# Patient Record
Sex: Male | Born: 1970 | Race: Black or African American | Hispanic: No | Marital: Married | Smoking: Current every day smoker
Health system: Southern US, Community
[De-identification: ages and names within clinical notes are randomized; demographics above are authoritative.]

---

## 2003-06-10 ENCOUNTER — Other Ambulatory Visit: Payer: Self-pay

## 2013-08-20 ENCOUNTER — Emergency Department: Payer: Self-pay | Admitting: Emergency Medicine

## 2013-08-20 LAB — BASIC METABOLIC PANEL
Anion Gap: 4 — ABNORMAL LOW (ref 7–16)
BUN: 8 mg/dL (ref 7–18)
CO2: 29 mmol/L (ref 21–32)
Calcium, Total: 8.4 mg/dL — ABNORMAL LOW (ref 8.5–10.1)
Chloride: 105 mmol/L (ref 98–107)
Creatinine: 1.1 mg/dL (ref 0.60–1.30)
EGFR (African American): >60
Glucose: 87 mg/dL (ref 65–99)
Osmolality: 273 (ref 275–301)
POTASSIUM: 3.7 mmol/L (ref 3.5–5.1)
SODIUM: 138 mmol/L (ref 136–145)

## 2013-08-20 LAB — CBC
HCT: 43 % (ref 40.0–52.0)
HGB: 13.7 g/dL (ref 13.0–18.0)
MCH: 28.4 pg (ref 26.0–34.0)
MCHC: 31.8 g/dL — ABNORMAL LOW (ref 32.0–36.0)
MCV: 90 fL (ref 80–100)
Platelet: 256 10*3/uL (ref 150–440)
RBC: 4.8 10*6/uL (ref 4.40–5.90)
RDW: 12.9 % (ref 11.5–14.5)
WBC: 6.1 10*3/uL (ref 3.8–10.6)

## 2013-08-20 LAB — TROPONIN I

## 2018-04-28 ENCOUNTER — Ambulatory Visit (INDEPENDENT_AMBULATORY_CARE_PROVIDER_SITE_OTHER): Payer: Worker's Compensation

## 2018-04-28 ENCOUNTER — Ambulatory Visit
Admission: EM | Admit: 2018-04-28 | Discharge: 2018-04-28 | Disposition: A | Discharge status: Home / Self Care | Payer: Worker's Compensation | Attending: Emergency Medicine | Admitting: Emergency Medicine

## 2018-04-28 ENCOUNTER — Other Ambulatory Visit: Payer: Self-pay

## 2018-04-28 DIAGNOSIS — R52 Pain, unspecified: Secondary | ICD-10-CM | POA: Diagnosis not present

## 2018-04-28 DIAGNOSIS — Z23 Encounter for immunization: Secondary | ICD-10-CM | POA: Diagnosis not present

## 2018-04-28 DIAGNOSIS — S62640A Nondisplaced fracture of proximal phalanx of right index finger, initial encounter for closed fracture: Secondary | ICD-10-CM

## 2018-04-28 DIAGNOSIS — X58XXXA Exposure to other specified factors, initial encounter: Secondary | ICD-10-CM

## 2018-04-28 MED ORDER — IBUPROFEN 600 MG PO TABS: 600.0000 mg | ORAL_TABLET | Freq: Four times a day (QID) | ORAL | 0 refills | Status: AC | PRN

## 2018-04-28 MED ORDER — TETANUS-DIPHTH-ACELL PERTUSSIS 5-2.5-18.5 LF-MCG/0.5 IM SUSP
0.5000 mL | Freq: Once | INTRAMUSCULAR | Status: AC
  Administered 2018-04-28: 0.5 mL via INTRAMUSCULAR

## 2018-04-28 NOTE — ED Triage Notes (Signed)
Pt reports Worker's comp. Yesterday he got his right hand caught between a piece of machinery and the wall. Abrasion between 1st and second finger right hand. First finger swollen

## 2018-04-28 NOTE — ED Provider Notes (Signed)
HPI  SUBJECTIVE:  Wayne Bryant is a right-handed 47 y.o. male who presents with right index finger pain, swelling, limitation of motion after wedging his hand in between a table and a machine door yesterday while at work.  He reports numbness distally.  He describes the pain as soreness, intermittent, present only with movement.  No alleviating factors.  He has not tried anything for this.  Symptoms are worse with moving his finger.  He is not on any anticoagulants or antiplatelets.  No history of diabetes, hypertension.  He is a smoker.  This is a workers comp case.  History reviewed. No pertinent past medical history.  History reviewed. No pertinent surgical history.  History reviewed. No pertinent family history.  Social History   Tobacco Use  . Smoking status: Current Every Day Smoker    Packs/day: 0.50    Types: Cigarettes  . Smokeless tobacco: Never Used  Substance Use Topics  . Alcohol use: Yes    Comment: social  . Drug use: Never    No current facility-administered medications for this encounter.   Current Outpatient Medications:  .  ibuprofen (ADVIL,MOTRIN) 600 MG tablet, Take 1 tablet (600 mg total) by mouth every 6 (six) hours as needed., Disp: 30 tablet, Rfl: 0  No Known Allergies   ROS  As noted in HPI.   Physical Exam  BP 121/67 (BP Location: Left Arm)   Pulse 72   Temp 98.2 F (36.8 C) (Oral)   Resp 16   Ht 5\' 8"  (1.727 m)   Wt 63.5 kg   SpO2 100%   BMI 21.29 kg/m   Constitutional: Well developed, well nourished, no acute distress Eyes:  EOMI, conjunctiva normal bilaterally HENT: Normocephalic, atraumatic,mucus membranes moist Respiratory: Normal inspiratory effort Cardiovascular: Normal rate GI: nondistended skin: No rash, skin intact Musculoskeletal: Positive swelling, tenderness at the proximal phalanx of the right index finger.  Positive bruising palm of hand.  Mild internal rotation of the right index finger.  FDP/FDS intact.   Two-point discrimination at the distal phalanx absent, but intact along the middle phalanx.  Cap refill at the distal phalanx less than 2 seconds. positive superficial laceration to volar finger at the proximal phalanx.  No other tenderness or evidence of injury to the hand.   Neurologic: Alert & oriented x 3, no focal neuro deficits and Psychiatric: Speech and behavior appropriate   ED Course   Medications  Tdap (BOOSTRIX) injection 0.5 mL (0.5 mLs Intramuscular Given 04/28/18 1244)    Orders Placed This Encounter  Procedures  . DG Hand Complete Right    Standing Status:   Standing    Number of Occurrences:   1    Order Specific Question:   Symptom/Reason for Exam    Answer:   Pain [[id number]]    Order Specific Question:   Radiology Contrast Protocol - do NOT remove file path    Answer:   [email or web address]  . Apply other splint    Standing Status:   Standing    Number of Occurrences:   1    Order Specific Question:   Laterality    Answer:   Right    Order Specific Question:   Splint type    Answer:   radial gutter    No results found for this or any previous visit (from the past 24 hour(s)). Dg Hand Complete Right  Result Date: 04/28/2018 CLINICAL DATA:  Right index finger crush injury. EXAM: RIGHT HAND - COMPLETE 3+ VIEW COMPARISON:  None. FINDINGS: Acute, nondisplaced fracture of the index finger proximal phalanx with extension into the MCP joint. No additional fracture. No dislocation. Joint spaces are preserved. Bone mineralization is normal. Soft tissue swelling of the index finger. IMPRESSION: 1. Acute, nondisplaced, intra-articular fracture of the index finger proximal phalanx. Electronically Signed   By: Obie DredgeWilliam T Derry M.D.   On: 04/28/2018 13:22    ED Clinical Impression  Closed nondisplaced fracture of proximal phalanx of right index finger, initial encounter  Pain - Plan: DG Hand Complete Right, DG Hand Complete Right   ED  Assessment/Plan  updating tetanus [place] Narcotic database reviewed for this patient, and feel that the risk/benefit ratio today is favorable for proceeding with a prescription for controlled substance.  No opiate prescriptions in the past 2 years.  Reviewed imaging independently.  Nondisplaced intra-articular fracture of the proximal phalanx of the index finger see radiology report for full details  patient is vascularly intact.  While he has absent sensation in the distal phalanx he does have it in the middle phalanx.  Suspect that this is temporary.  No evidence of compartment syndrome.  Do not think the patient needs antibiotics for the laceration, is to keep clean with soap and water and apply bacitracin.  Placing in ulnar gutter splint.   Will refer him to Fannie Kneeatherine McManama at [facility] and [facility] for further evaluation and referral to hand surgery because of the internal rotation of his finger and the intra-articular extension of the fracture.  Patient declined prescription of pain medicines.  600 mg ibuprofen and gram of Tylenol 3 or 4 times a day as needed. filled out [employer].  Discussed imaging, MDM, treatment plan, and plan for follow-up with patient.  patient agrees with plan.   Meds ordered this encounter  Medications  . Tdap (BOOSTRIX) injection 0.5 mL  . ibuprofen (ADVIL,MOTRIN) 600 MG tablet    Sig: Take 1 tablet (600 mg total) by mouth every 6 (six) hours as needed.    Dispense:  30 tablet    Refill:  0    *This clinic note was created using Scientist, clinical (histocompatibility and immunogenetics)Dragon dictation software. Therefore, there may be occasional mistakes despite careful proofreading.   ?   Domenick GongMortenson, Gladyse Corvin, MD 04/28/18 1402

## 2018-04-28 NOTE — Discharge Instructions (Addendum)
1 g of Tylenol 3 or 4 times a day as needed for pain.  Ice your finger for 20 minutes at a time.  Wear the splint at all times until you are seen by occupational health.  You may need a referral to hand surgery.

## 2019-04-29 ENCOUNTER — Other Ambulatory Visit: Payer: Self-pay

## 2019-04-29 ENCOUNTER — Ambulatory Visit: Payer: Self-pay | Admitting: Physician Assistant

## 2019-04-29 ENCOUNTER — Encounter: Payer: Self-pay | Admitting: Physician Assistant

## 2019-04-29 DIAGNOSIS — Z113 Encounter for screening for infections with a predominantly sexual mode of transmission: Secondary | ICD-10-CM

## 2019-04-29 DIAGNOSIS — Z202 Contact with and (suspected) exposure to infections with a predominantly sexual mode of transmission: Secondary | ICD-10-CM

## 2019-04-29 MED ORDER — METRONIDAZOLE 500 MG PO TABS: 2000.0000 mg | ORAL_TABLET | Freq: Once | ORAL | 0 refills | Status: AC

## 2019-04-29 NOTE — Progress Notes (Signed)
    STI clinic/screening visit  Subjective:  Wayne Bryant is a 48 y.o. male being seen today for an STI screening visit. The patient reports they do not have symptoms.  Patient has the following medical conditions:  There are no active problems to display for this patient.    Chief Complaint  Patient presents with  . SEXUALLY TRANSMITTED DISEASE    HPI  Patient reports that he is a contact to [place].  Denies symptoms and concerns today.  See flowsheet for further details and programmatic requirements.    The following portions of the patient's history were reviewed and updated as appropriate: allergies, current medications, past medical history, past social history, past surgical history and problem list.  Objective:  There were no vitals filed for this visit.  Physical Exam Constitutional:      General: He is not in acute distress.    Appearance: Normal appearance.  HENT:     Head: Normocephalic and atraumatic.     Mouth/Throat:     Mouth: Mucous membranes are moist.     Pharynx: Oropharynx is clear. No oropharyngeal exudate or posterior oropharyngeal erythema.  Eyes:     Conjunctiva/sclera: Conjunctivae normal.  Neck:     Musculoskeletal: Neck supple.  Pulmonary:     Effort: Pulmonary effort is normal.  Abdominal:     Palpations: Abdomen is soft. There is no mass.     Tenderness: There is no abdominal tenderness. There is no guarding or rebound.  Genitourinary:    Penis: Normal.      Scrotum/Testes: Normal.     Comments: Pubic area without nits, lice, edema, erythema, lesions and inguinal adenopathy. Penis circumcised and without discharge at meatus. Lymphadenopathy:     Cervical: No cervical adenopathy.  Skin:    General: Skin is warm and dry.     Findings: No bruising, erythema, lesion or rash.  Neurological:     Mental Status: He is alert and oriented to person, place, and time.  Psychiatric:        Mood and Affect: Mood normal.        Behavior:  Behavior normal.        Thought Content: Thought content normal.        Judgment: Judgment normal.       Assessment and Plan:  Wayne Bryant is a 48 y.o. male presenting to the [facility] Department for STI screening  1. Screening for STD (sexually transmitted disease) Patient into clinic without symptoms. Rec condoms with all sex. Await test results.  Counseled that RN will call if needs to RTC for further treatment. - Chlamydia/Gonorrhea [facility] - HIV [facility] - Syphilis Serology, [place]  2. Venereal disease contact Will treat as a contact to [place] with Metronidazole 2 g po at one time with food, no EtOH for 24 hr before and until 72 hr after completing medicine. No sex for 7 days and until after partner completes treatment. RTC for re-treatment if vomits < 2 hr after taking medicine.  - metroNIDAZOLE (FLAGYL) 500 MG tablet; Take 4 tablets (2,000 mg total) by mouth once for 1 dose.  Dispense: 4 tablet; Refill: 0     No follow-ups on file.  No future appointments.  Jerene Dilling, PA

## 2020-04-24 ENCOUNTER — Encounter: Payer: Self-pay | Admitting: Emergency Medicine

## 2020-04-24 ENCOUNTER — Emergency Department: Payer: BC Managed Care – PPO

## 2020-04-24 ENCOUNTER — Other Ambulatory Visit: Payer: Self-pay

## 2020-04-24 ENCOUNTER — Emergency Department
Admission: EM | Admit: 2020-04-24 | Discharge: 2020-04-24 | Disposition: A | Discharge status: Home / Self Care | Payer: BC Managed Care – PPO | Attending: Emergency Medicine | Admitting: Emergency Medicine

## 2020-04-24 DIAGNOSIS — S0003XA Contusion of scalp, initial encounter: Secondary | ICD-10-CM | POA: Insufficient documentation

## 2020-04-24 DIAGNOSIS — W11XXXA Fall on and from ladder, initial encounter: Secondary | ICD-10-CM | POA: Diagnosis not present

## 2020-04-24 DIAGNOSIS — S2232XA Fracture of one rib, left side, initial encounter for closed fracture: Secondary | ICD-10-CM | POA: Diagnosis not present

## 2020-04-24 DIAGNOSIS — F17210 Nicotine dependence, cigarettes, uncomplicated: Secondary | ICD-10-CM | POA: Diagnosis not present

## 2020-04-24 DIAGNOSIS — W19XXXA Unspecified fall, initial encounter: Secondary | ICD-10-CM

## 2020-04-24 DIAGNOSIS — R0781 Pleurodynia: Secondary | ICD-10-CM | POA: Diagnosis present

## 2020-04-24 MED ORDER — HYDROCODONE-ACETAMINOPHEN 5-325 MG PO TABS: 1.0000 | ORAL_TABLET | Freq: Four times a day (QID) | ORAL | 0 refills | Status: AC | PRN

## 2020-04-24 NOTE — Discharge Instructions (Addendum)
Follow-up with your primary care provider or [facility] acute care if any continued problems or concerns.  Return to the emergency department if any severe worsening of your symptoms over the weekend.  A prescription for pain medication was sent to your pharmacy.  This is 1 every 6 hours as needed for moderate pain.  You may also take ibuprofen with this medication if needed which will help with inflammation.  Use ice to your rib as needed for discomfort and also use a pillow and take deep breaths to avoid getting pneumonia.  Generally it takes approximately 4 weeks to heal.  Follow-up with your primary care provider if any continued problems.

## 2020-04-24 NOTE — ED Triage Notes (Signed)
Pt presents to ED via POV with c/o fall 2 days ago at approx 1730pm. Pt states was able to go to work yesterday. Pt states fell off bottom few steps of a ladder. Pt states +LOC, c/o L rib cage pain. Pt A&O x4, NAD noted, pt ambulatory without difficulty at this time. Pt with noted full ROM to all extremities.

## 2020-04-24 NOTE — ED Notes (Signed)
See triage note.  Says he was several rungs up on ladder and slipped and fell on left side ont to grass and hit head.  Says he may have lost consciousness because when he opened his eyes every thing was blurry.  He ambulated to the room without difficulty.

## 2020-04-24 NOTE — ED Provider Notes (Signed)
[facility] Emergency Department Provider Note   ____________________________________________   First MD Initiated Contact with Patient 04/24/20 1133     (approximate)  I have reviewed the triage vital signs and the nursing notes.   HISTORY  Chief Complaint Fall   HPI Wayne Bryant is a 49 y.o. male presents to the ED with complaint of pain to his left rib cage after a fall that occurred 2 days ago.  Patient states that he was coming down a ladder after removing some leaves from a gutter and fell from the ladder onto the ground approximately 4 to 5 feet.  He states he did hit his head by falling backwards and possible LOC.  He denies any nausea, vomiting or visual changes at this time.  He states for short period of time things went "blurry".  Patient also has pain to his left ribs when taking deep breaths.  He rates his pain as a 10 out of 10.       History reviewed. No pertinent past medical history.  There are no problems to display for this patient.   History reviewed. No pertinent surgical history.  Prior to Admission medications   Medication Sig Start Date End Date Taking? Authorizing Provider  HYDROcodone-acetaminophen (NORCO/VICODIN) 5-325 MG tablet Take 1 tablet by mouth every 6 (six) hours as needed for moderate pain. 04/24/20   Tommi Rumps, PA-C  ibuprofen (ADVIL,MOTRIN) 600 MG tablet Take 1 tablet (600 mg total) by mouth every 6 (six) hours as needed. 04/28/18   Domenick Gong, MD    Allergies Patient has no known allergies.  No family history on file.  Social History Social History   Tobacco Use  . Smoking status: Current Every Day Smoker    Packs/day: 0.50    Types: Cigarettes  . Smokeless tobacco: Never Used  Substance Use Topics  . Alcohol use: Yes    Comment: social  . Drug use: Never    Review of Systems Constitutional: No fever/chills Eyes: No visual changes. ENT: No sore throat.  No facial  trauma. Cardiovascular: Denies chest pain. Respiratory: Denies shortness of breath.  Positive left rib pain. Gastrointestinal: No abdominal pain.  No nausea, no vomiting.  No diarrhea.  Genitourinary: Negative for dysuria. Musculoskeletal: Negative for back pain.  Negative for cervical pain. Skin: Negative for rash. Neurological: Negative for headaches, focal weakness or numbness. ____________________________________________   PHYSICAL EXAM:  VITAL SIGNS: ED Triage Vitals [04/24/20 1114]  Enc Vitals Group     BP 123/65     Pulse Rate 71     Resp 18     Temp 98.4 F (36.9 C)     Temp Source Oral     SpO2 100 %     Weight 130 lb (59 kg)     Height 5\' 8"  (1.727 m)     Head Circumference      Peak Flow      Pain Score 10     Pain Loc      Pain Edu?      Excl. in GC?     Constitutional: Alert and oriented. Well appearing and in no acute distress. Eyes: Conjunctivae are normal. PERRL. EOMI. Head: Atraumatic. Nose: No congestion/rhinnorhea. Mouth/Throat: Mucous membranes are moist.  Oropharynx non-erythematous. Neck: No stridor.  No cervical pain on palpation posteriorly.  Range of motion is that restriction or pain. Cardiovascular: Normal rate, regular rhythm. Grossly normal heart sounds.  Good peripheral circulation. Respiratory: Normal respiratory effort.  No retractions. Lungs CTAB.  Left lateral ribs are extremely tender to palpation especially proximal May 9, 10th and 11th area laterally.  No soft tissue edema or discoloration is noted of the skin.  Gait is intact. Gastrointestinal: Soft and nontender. No distention.  Sounds are normoactive x4 quadrants. Musculoskeletal: Moves upper and lower extremities with any difficulty.  Normal gait was noted.  No tenderness is noted on palpation of the thoracic or lumbar spine.  Normal gait was noted. Neurologic:  Normal speech and language. No gross focal neurologic deficits are appreciated. No gait instability. Skin:  Skin is warm,  dry and intact. No rash noted. Psychiatric: Mood and affect are normal. Speech and behavior are normal.  ____________________________________________   LABS (all labs ordered are listed, but only abnormal results are displayed)  Labs Reviewed - No data to display ____________________________________________    RADIOLOGY I, Tommi Rumps, personally viewed and evaluated these images (plain radiographs) as part of my medical decision making, as well as reviewing the written report by the radiologist.   Official radiology report(s): DG Ribs Unilateral W/Chest Left  Result Date: 04/24/2020 CLINICAL DATA:  Status post fall.  Larey Seat off a ladder. EXAM: LEFT RIBS AND CHEST - 3+ VIEW COMPARISON:  08/20/2013 FINDINGS: Nondisplaced fracture of the left tenth anterolateral rib. There is no evidence of pneumothorax or pleural effusion. Both lungs are clear. Heart size and mediastinal contours are within normal limits. IMPRESSION: Nondisplaced fracture of the left tenth anterolateral rib. Electronically Signed   By: Elige Ko   On: 04/24/2020 12:56   CT Head Wo Contrast  Result Date: 04/24/2020 CLINICAL DATA:  Fall, head trauma EXAM: CT HEAD WITHOUT CONTRAST TECHNIQUE: Contiguous axial images were obtained from the base of the skull through the vertex without intravenous contrast. COMPARISON:  None. FINDINGS: Brain: No evidence of acute infarction, hemorrhage, hydrocephalus, extra-axial collection or mass lesion/mass effect. Vascular: No hyperdense vessel or unexpected calcification. Skull: Normal. Negative for fracture or focal lesion. Sinuses/Orbits: No acute finding. Other: None. IMPRESSION: No acute intracranial findings. Electronically Signed   By: Duanne Guess D.O.   On: 04/24/2020 13:01    ____________________________________________   PROCEDURES  Procedure(s) performed (including Critical Care):  Procedures   ____________________________________________   INITIAL  IMPRESSION / ASSESSMENT AND PLAN / ED COURSE  As part of my medical decision making, I reviewed the following data within the electronic MEDICAL RECORD NUMBER Notes from prior ED visits and [place] Controlled Substance Database  49 year old male presents to the ED with complaint of continued left rib pain after he fell approximately 5 feet while on a ladder cleaning the gutter.  Patient states that he fell backwards landing on his left side but also hitting the back of his head.  There is a question of LOC but he denies any continued nausea, vomiting or visual changes.  Patient has increased pain with deep inspiration.  Physical exam was benign however left rib x-ray does show 1 nondisplaced fracture of the 10th rib.  Patient was made aware.  A prescription for hydrocodone was sent to his pharmacy to take every 6 hours as needed for moderate pain.  He is also encouraged to take ibuprofen for inflammation.  We discussed using a pillow when he is taking deep breaths or feels that he is going to cough to help splint the area and prevent pneumonia by taking deep breaths.  Patient is to follow-up with his PCP or [facility] acute care if any continued problems. ____________________________________________  FINAL CLINICAL IMPRESSION(S) / ED DIAGNOSES  Final diagnoses:  Closed fracture of one rib of left side, initial encounter  Contusion of scalp, initial encounter  Fall, initial encounter     ED Discharge Orders         Ordered    HYDROcodone-acetaminophen (NORCO/VICODIN) 5-325 MG tablet  Every 6 hours PRN        04/24/20 1318          *Please note:  Adrean Heitz was evaluated in Emergency Department on 04/24/2020 for the symptoms described in the history of present illness. He was evaluated in the context of the global COVID-19 pandemic, which necessitated consideration that the patient might be at risk for infection with the SARS-CoV-2 virus that causes COVID-19. Institutional protocols and  algorithms that pertain to the evaluation of patients at risk for COVID-19 are in a state of rapid change based on information released by regulatory bodies including the CDC and federal and state organizations. These policies and algorithms were followed during the patient's care in the ED.  Some ED evaluations and interventions may be delayed as a result of limited staffing during and the pandemic.*   Note:  This document was prepared using Dragon voice recognition software and may include unintentional dictation errors.    Tommi Rumps, PA-C 04/24/20 1332    Delton Prairie, MD 04/24/20 [phone]

## 2020-09-10 ENCOUNTER — Ambulatory Visit: Payer: Self-pay | Admitting: Physician Assistant

## 2020-09-10 ENCOUNTER — Encounter: Payer: Self-pay | Admitting: Physician Assistant

## 2020-09-10 ENCOUNTER — Other Ambulatory Visit: Payer: Self-pay

## 2020-09-10 DIAGNOSIS — Z113 Encounter for screening for infections with a predominantly sexual mode of transmission: Secondary | ICD-10-CM

## 2020-09-10 DIAGNOSIS — N341 Nonspecific urethritis: Secondary | ICD-10-CM

## 2020-09-10 LAB — GRAM STAIN

## 2020-09-10 MED ORDER — DOXYCYCLINE HYCLATE 100 MG PO TABS: 100.0000 mg | ORAL_TABLET | Freq: Two times a day (BID) | ORAL | 0 refills | Status: AC

## 2020-09-13 ENCOUNTER — Encounter: Payer: Self-pay | Admitting: Physician Assistant

## 2020-09-13 NOTE — Progress Notes (Signed)
[facility] Department STI clinic/screening visit  Subjective:  Wayne Bryant is a 50 y.o. male being seen today for an STI screening visit. The patient reports they do have symptoms.    Patient has the following medical conditions:  There are no problems to display for this patient.    Chief Complaint  Patient presents with  . SEXUALLY TRANSMITTED DISEASE    screening    HPI  Patient reports that he has had "a funny feeling when peeing" for about 1 week.  Denies other symptoms, chronic conditions, and surgeries.  Reports that he last had a HIV test about 7-8 years ago and last void prior to sample collection for Gram stain was over 2 hr ago.     See flowsheet for further details and programmatic requirements.    The following portions of the patient's history were reviewed and updated as appropriate: allergies, current medications, past medical history, past social history, past surgical history and problem list.  Objective:  There were no vitals filed for this visit.  Physical Exam Constitutional:      General: He is not in acute distress.    Appearance: Normal appearance.  HENT:     Head: Normocephalic and atraumatic.     Comments: No nits,lice, or hair loss. No cervical, supraclavicular or axillary adenopathy.    Mouth/Throat:     Mouth: Mucous membranes are moist.     Pharynx: Oropharynx is clear. No oropharyngeal exudate or posterior oropharyngeal erythema.  Eyes:     Conjunctiva/sclera: Conjunctivae normal.  Pulmonary:     Effort: Pulmonary effort is normal.  Abdominal:     Palpations: Abdomen is soft. There is no mass.     Tenderness: There is no abdominal tenderness. There is no guarding or rebound.  Genitourinary:    Penis: Normal.      Testes: Normal.     Comments: Pubic area without nits, lice, hair loss, edema, erythema, lesions and inguinal adenopathy. Penis circumcised without rash or lesions. Small amount of whitish/clear  discharge at meatus Testicles descended bilaterally,nt, no masses or edema. Musculoskeletal:     Cervical back: Neck supple. No tenderness.  Skin:    General: Skin is warm and dry.     Findings: No bruising, erythema, lesion or rash.  Neurological:     Mental Status: He is alert and oriented to person, place, and time.  Psychiatric:        Mood and Affect: Mood normal.        Behavior: Behavior normal.        Thought Content: Thought content normal.        Judgment: Judgment normal.       Assessment and Plan:  Kinston Magnan is a 50 y.o. male presenting to the [facility] Department for STI screening  1. Screening for STD (sexually transmitted disease) Patient into clinic with symptoms. Reviewed with patient Gram stain results. Rec condoms with all sex. Await test results.  Counseled that RN will call if needs to RTC for treatment once results are back. - Gram stain - Gonococcus culture - HIV [facility] - Syphilis Serology, [facility]  2. NGU (nongonococcal urethritis) Will treat for NGU due to risks and exam finding with Doxycycline 100mg  #14 1 po BID for 7 days. No sex for 14 days. Call with questions or concerns. - doxycycline (VIBRA-TABS) 100 MG tablet; Take 1 tablet (100 mg total) by mouth 2 (two) times daily for 7 days.  Dispense: 14 tablet; Refill: 0     No follow-ups on file.  No future appointments.  Matt Holmes, PA

## 2020-09-15 LAB — GONOCOCCUS CULTURE

## 2020-09-16 LAB — HM HIV SCREENING LAB: HM HIV Screening: NEGATIVE

## 2020-09-18 NOTE — Progress Notes (Signed)
Chart reviewed by Pharmacist  Suzanne Walker PharmD, Contract Pharmacist at [facility]  

## 2021-03-17 ENCOUNTER — Ambulatory Visit: Payer: Self-pay | Admitting: Physician Assistant

## 2021-03-17 ENCOUNTER — Other Ambulatory Visit: Payer: Self-pay

## 2021-03-17 DIAGNOSIS — A5401 Gonococcal cystitis and urethritis, unspecified: Secondary | ICD-10-CM

## 2021-03-17 DIAGNOSIS — Z113 Encounter for screening for infections with a predominantly sexual mode of transmission: Secondary | ICD-10-CM

## 2021-03-17 LAB — GRAM STAIN

## 2021-03-17 MED ORDER — DOXYCYCLINE HYCLATE 100 MG PO TABS: 100.0000 mg | ORAL_TABLET | Freq: Two times a day (BID) | ORAL | 0 refills | Status: AC

## 2021-03-17 MED ORDER — CEFTRIAXONE SODIUM 500 MG IJ SOLR
500.0000 mg | Freq: Once | INTRAMUSCULAR | Status: AC
  Administered 2021-03-17: 500 mg via INTRAMUSCULAR

## 2021-03-17 NOTE — Progress Notes (Signed)
Patient here for STD screeing, Gram Stain reviewed, tx per orders. Condoms given. Ceftriaxone given IM in R deltoid. Doxycycline given PO.

## 2021-03-18 ENCOUNTER — Encounter: Payer: Self-pay | Admitting: Physician Assistant

## 2021-03-18 NOTE — Progress Notes (Signed)
[facility] Department STI clinic/screening visit  Subjective:  Wayne Bryant is a 50 y.o. male being seen today for an STI screening visit. The patient reports they do have symptoms.    Patient has the following medical conditions:  There are no problems to display for this patient.    Chief Complaint  Patient presents with   SEXUALLY TRANSMITTED DISEASE    screening    HPI  Patient reports that he has had a "funny feeling" with urination for 1 week.  Denies chronic conditions, surgeries and regular medicines.  Reports last HIV test was 6 months ago  and last void prior to sample collection for Gram stain was over 2 hr ago.    Screening for MPX risk: Does the patient have an unexplained rash? No Is the patient MSM? No Does the patient endorse multiple sex partners or anonymous sex partners? No Did the patient have close or sexual contact with a person diagnosed with MPX? No Has the patient traveled outside the [place] where MPX is endemic? No Is there a high clinical suspicion for MPX-- evidenced by one of the following No  -Unlikely to be chickenpox  -Lymphadenopathy  -Rash that present in same phase of evolution on any given body part   See flowsheet for further details and programmatic requirements.    The following portions of the patient's history were reviewed and updated as appropriate: allergies, current medications, past medical history, past social history, past surgical history and problem list.  Objective:  There were no vitals filed for this visit.  Physical Exam Constitutional:      General: He is not in acute distress.    Appearance: Normal appearance.  HENT:     Head: Normocephalic and atraumatic.     Comments: No nits,lice, or hair loss. No cervical, supraclavicular or axillary adenopathy.     Mouth/Throat:     Mouth: Mucous membranes are moist.     Pharynx: Oropharynx is clear. No oropharyngeal exudate or posterior oropharyngeal  erythema.  Eyes:     Conjunctiva/sclera: Conjunctivae normal.  Pulmonary:     Effort: Pulmonary effort is normal.  Abdominal:     Palpations: Abdomen is soft. There is no mass.     Tenderness: There is no abdominal tenderness. There is no guarding or rebound.  Genitourinary:    Penis: Normal.      Testes: Normal.     Comments: Pubic area without nits, lice, hair loss, edema, erythema, lesions and inguinal adenopathy. Penis circumcised without rash or lesions. Patient with small amount of yellow discharge at meatus. Testicles descended bilaterally,nt, no masses or edema.  Musculoskeletal:     Cervical back: Neck supple. No tenderness.  Skin:    General: Skin is warm and dry.     Findings: No bruising, erythema, lesion or rash.  Neurological:     Mental Status: He is alert and oriented to person, place, and time.  Psychiatric:        Mood and Affect: Mood normal.        Behavior: Behavior normal.        Thought Content: Thought content normal.        Judgment: Judgment normal.      Assessment and Plan:  Demone Lyles is a 50 y.o. male presenting to the [facility] Department for STI screening  1. Screening for STD (sexually transmitted disease) Patient into clinic with symptoms. Rec condoms with all sex. Await test results.  Counseled that RN  will call if needs to RTC for treatment once results are back.  - Gram stain - HIV [place] [facility] - Syphilis Serology, [facility]  2. Gonococcal urethritis in male Treat for GC and cover for Chlamydia with Ceftriaxone 500 mg IM and with Doxycycline 100 mg #14 1 po BID for 7 days. No sex for 14 days and until after partner completes treatment. Call with questions or concerns.  - cefTRIAXone (ROCEPHIN) injection 500 mg - doxycycline (VIBRA-TABS) 100 MG tablet; Take 1 tablet (100 mg total) by mouth 2 (two) times daily for 7 days.  Dispense: 14 tablet; Refill: 0     No follow-ups on file.  No future  appointments.  Matt Holmes, PA

## 2021-12-30 IMAGING — CT CT HEAD W/O CM
4 series · 15 of 47 positions shown, 17 images · non-contrast
Comparison: None.

CLINICAL DATA: Fall, head trauma

EXAM:
CT HEAD WITHOUT CONTRAST
TECHNIQUE: Contiguous axial images were obtained from the base of the skull
through the vertex without intravenous contrast.

[Series 2: head bone · axial · 0.41mm/px · z∈[-168,-154]mm · 2 of 74 slices shown]
[im 8/74  bone]
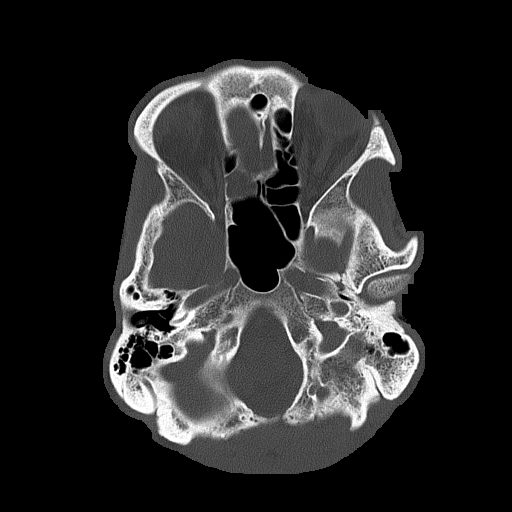
[im 15/74  bone]
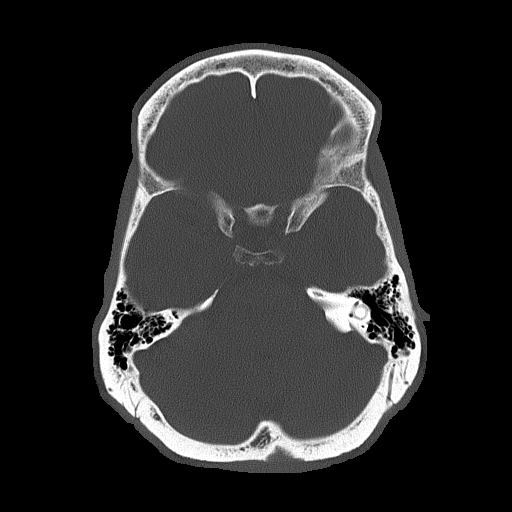

[Series 3: ax head wo · axial · 0.33mm/px · z∈[-173,-64]mm · 7 of 30 slices shown, 9 images]
[im 4/30  brain]
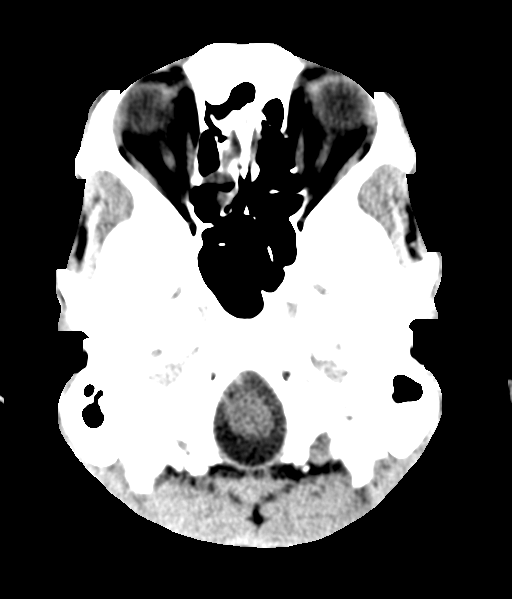
[im 4/30  bone]
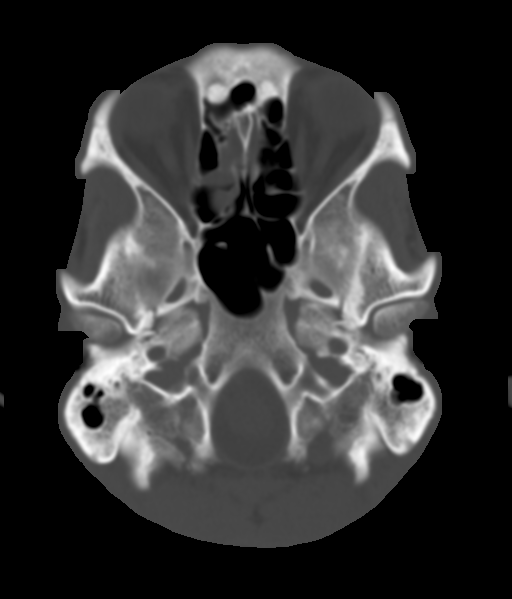
[im 8/30  brain]
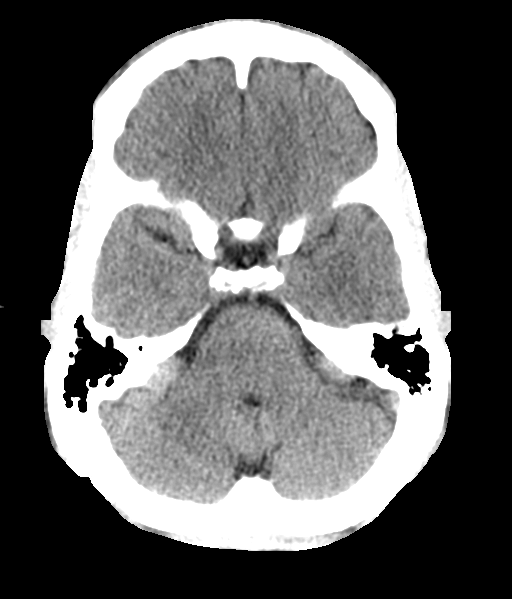
[im 11/30  brain]
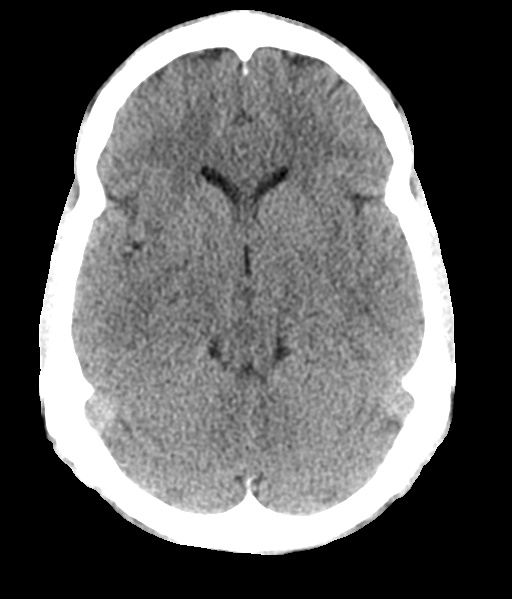
[im 15/30  brain]
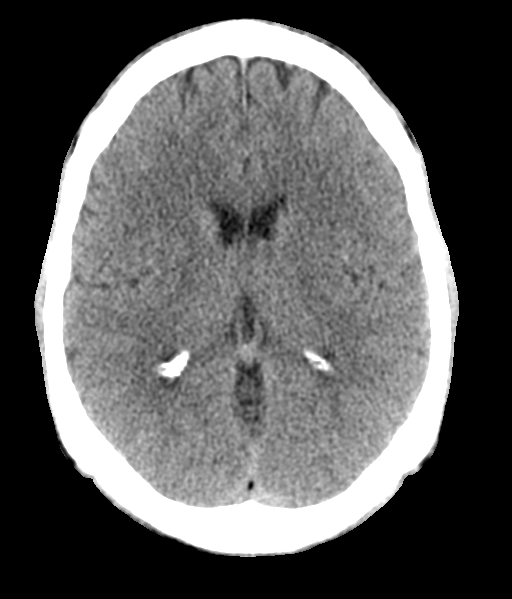
[im 19/30  brain]
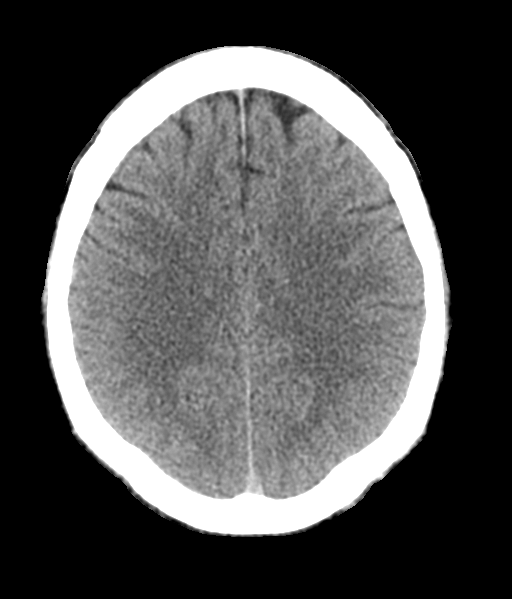
[im 19/30  bone]
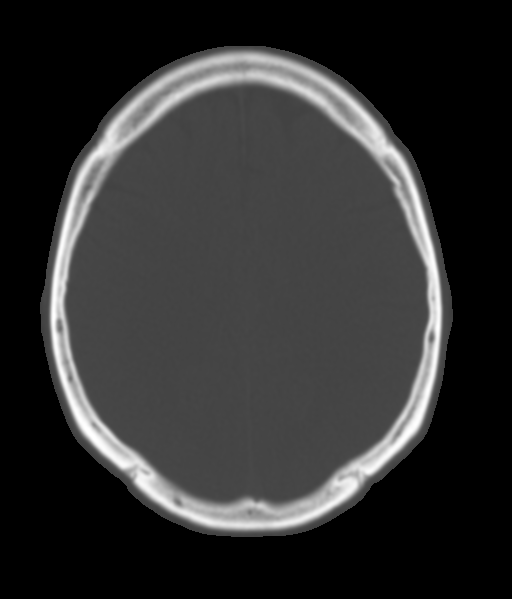
[im 22/30  brain]
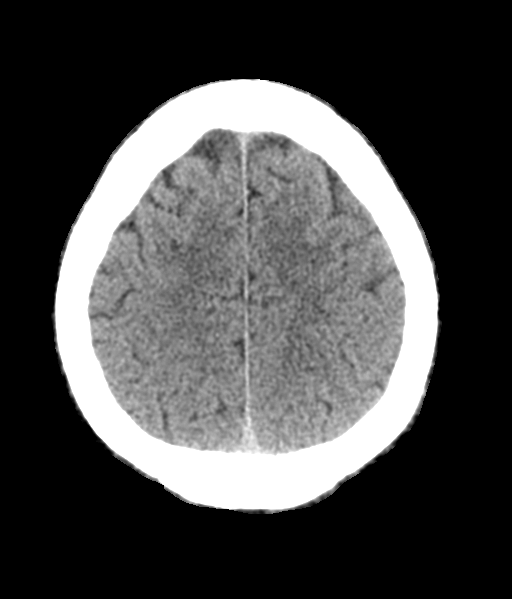
[im 26/30  brain]
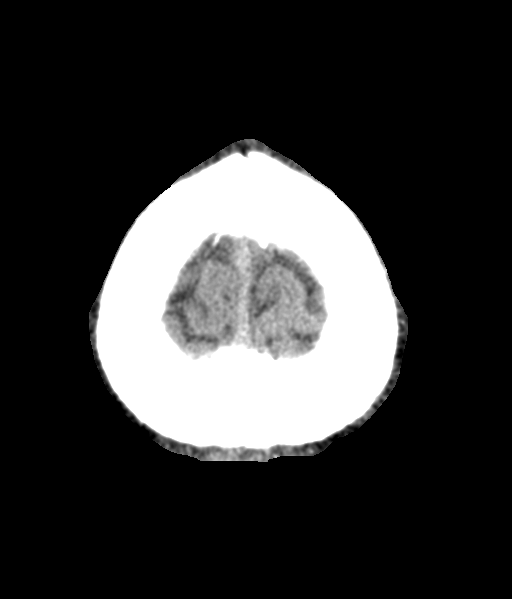

[Series 4: coronal soft tissue · coronal · 0.29mm/px · 3 of 61 slices shown]
[im 21/61  brain]
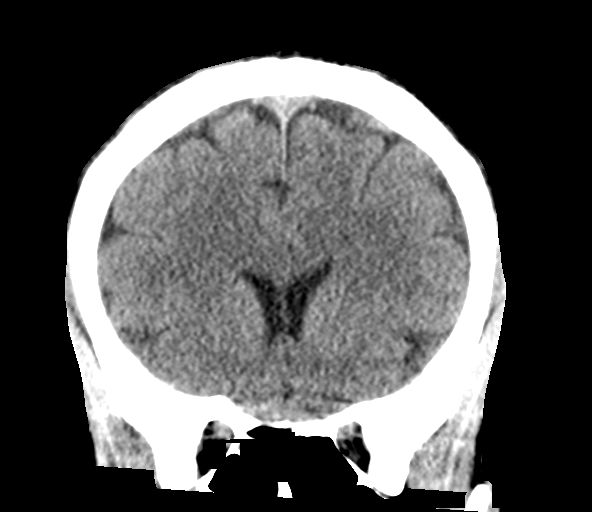
[im 27/61  brain]
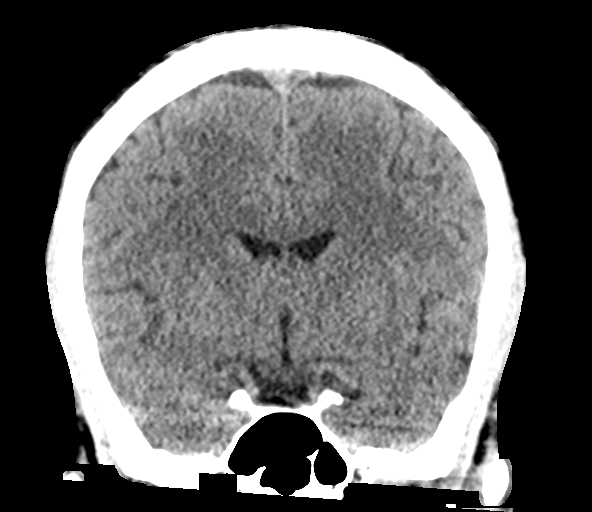
[im 34/61  brain]
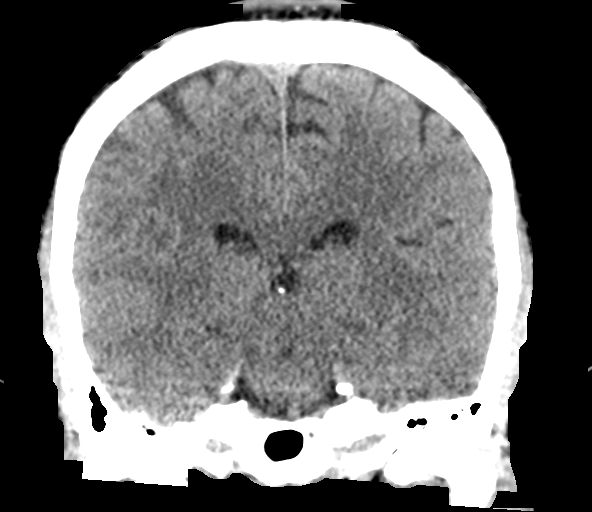

[Series 5: sagittal soft tissue · sagittal · 0.29mm/px · 3 of 49 slices shown]
[im 17/49  brain]
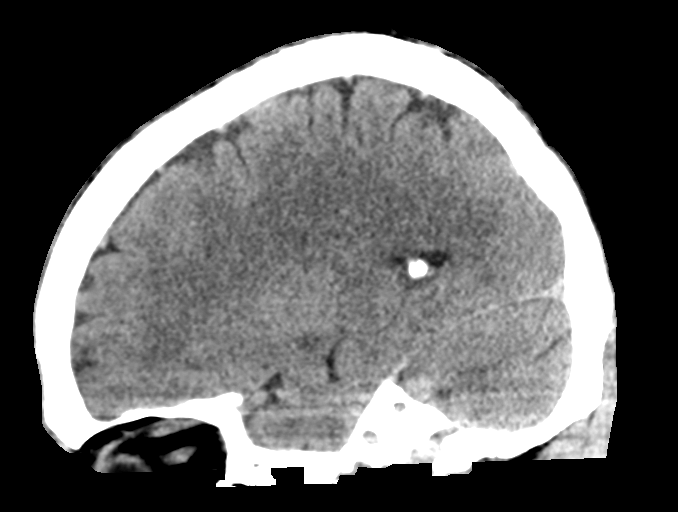
[im 25/49  brain]
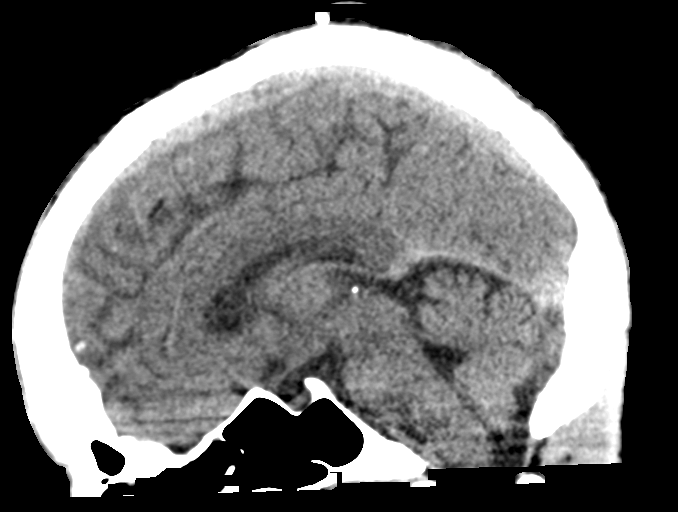
[im 33/49  brain]
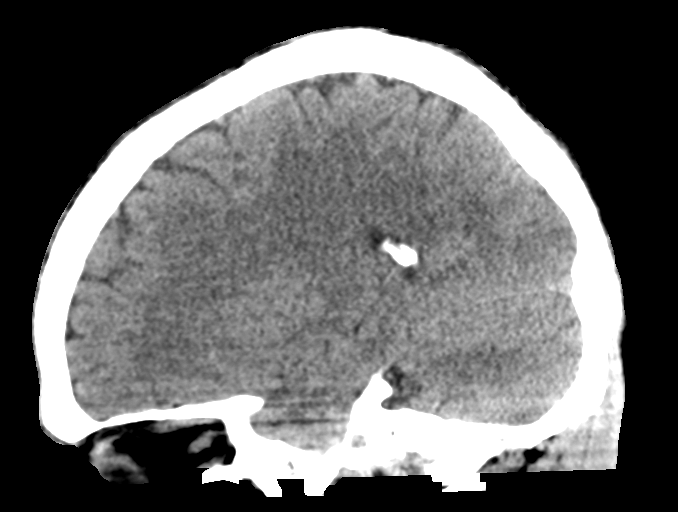

[15 of 47 positions shown; findings below may reference images not displayed]

FINDINGS: Brain: No evidence of acute infarction, hemorrhage, hydrocephalus,
extra-axial collection or mass lesion/mass effect.

Vascular: No hyperdense vessel or unexpected calcification.

Skull: Normal. Negative for fracture or focal lesion.

Sinuses/Orbits: No acute finding.

Other: None.
IMPRESSION: No acute intracranial findings.

## 2022-09-07 ENCOUNTER — Encounter: Payer: Self-pay | Admitting: Family

## 2022-09-07 ENCOUNTER — Ambulatory Visit: Payer: BC Managed Care – PPO

## 2022-09-07 ENCOUNTER — Ambulatory Visit: Payer: Self-pay | Admitting: Family

## 2022-09-07 DIAGNOSIS — N341 Nonspecific urethritis: Secondary | ICD-10-CM

## 2022-09-07 DIAGNOSIS — Z113 Encounter for screening for infections with a predominantly sexual mode of transmission: Secondary | ICD-10-CM

## 2022-09-07 LAB — GRAM STAIN

## 2022-09-07 LAB — HM HIV SCREENING LAB: HM HIV Screening: NEGATIVE

## 2022-09-07 MED ORDER — DOXYCYCLINE HYCLATE 100 MG PO TABS: 100.0000 mg | ORAL_TABLET | Freq: Two times a day (BID) | ORAL | 0 refills | Status: AC

## 2022-09-07 NOTE — Progress Notes (Signed)
[facility] Department STI clinic/screening visit  Subjective:  Wayne Bryant is a 52 y.o. male being seen today for an STI screening visit. The patient reports they do have symptoms.    Patient has the following medical conditions:  There are no problems to display for this patient.    Chief Complaint  Patient presents with   SEXUALLY TRANSMITTED DISEASE    "The tip of my penis itches" x 3 days.    HPI  Patient reports itching at the tip of his penis for past 3 days, one week after last sex, has 1 sexual partner for the past 1 year, thinks it is mutually monogamous but not sure because he works 60 hours per week.   Last HIV test per patient/review of record was  Lab Results  Component Value Date   HMHIVSCREEN Negative - Validated 09/16/2020   No results found for: "HIV"  Does the patient or their partner desires a pregnancy in the next year? No  Screening for MPX risk: Does the patient have an unexplained rash? No Is the patient MSM? No Does the patient endorse multiple sex partners or anonymous sex partners? No Did the patient have close or sexual contact with a person diagnosed with MPX? No Has the patient traveled outside the [place] where MPX is endemic? No Is there a high clinical suspicion for MPX-- evidenced by one of the following No  -Unlikely to be chickenpox  -Lymphadenopathy  -Rash that present in same phase of evolution on any given body part   See flowsheet for further details and programmatic requirements.   Immunization History  Administered Date(s) Administered   Tdap 04/28/2018     The following portions of the patient's history were reviewed and updated as appropriate: allergies, current medications, past medical history, past social history, past surgical history and problem list.  Objective:  There were no vitals filed for this visit.  Physical Exam Constitutional:      Appearance: Normal appearance.  HENT:     Head:  Normocephalic and atraumatic.     Comments: No nits or hair loss    Mouth/Throat:     Mouth: Mucous membranes are moist. No oral lesions.     Pharynx: Oropharynx is clear. No oropharyngeal exudate or posterior oropharyngeal erythema.  Eyes:     General:        Right eye: No discharge.        Left eye: No discharge.     Conjunctiva/sclera:     Right eye: Right conjunctiva is not injected. No exudate.    Left eye: Left conjunctiva is not injected. No exudate. Pulmonary:     Effort: Pulmonary effort is normal.  Abdominal:     General: Abdomen is flat.     Palpations: Abdomen is soft. There is no hepatomegaly or mass.     Tenderness: There is no abdominal tenderness. There is no rebound.     Hernia: There is no hernia in the left inguinal area or right inguinal area.  Genitourinary:    Pubic Area: No rash or pubic lice (no nits).      Penis: Discharge (moderate amount yellow discharge draining from penis) present. No tenderness, swelling or lesions.      Testes: Normal.     Epididymis:     Right: Normal. No mass or tenderness.     Left: Normal. No mass or tenderness.     Rectum: Normal. No tenderness (no lesions or discharge).  Lymphadenopathy:  Head:     Right side of head: No preauricular or posterior auricular adenopathy.     Left side of head: No preauricular or posterior auricular adenopathy.     Cervical: No cervical adenopathy.     Upper Body:     Right upper body: No supraclavicular, axillary or epitrochlear adenopathy.     Left upper body: No supraclavicular, axillary or epitrochlear adenopathy.     Lower Body: No right inguinal adenopathy. No left inguinal adenopathy.  Skin:    General: Skin is warm and dry.     Findings: No lesion or rash.  Neurological:     Mental Status: He is alert and oriented to person, place, and time.       Assessment and Plan:  Wayne Bryant is a 52 y.o. male presenting to the [place] for STI  screening  1. Screening for venereal disease Will contact with positive results Use condoms for all sex - Gram stain - Chlamydia/GC NAA, Confirmation - HIV [place] [facility] - Syphilis Serology, [place] [facility]  2. Nongonococcal urethritis (NGU) Doxycycline 100mg  PO BID x 7 days, #14 Partner notification advised Abstain from sex for 7 days after self and partner treatment completed   Patient does have STI symptoms Patient accepted all screenings including  urine GC/Chlamydia, and blood work for HIV/Syphilis. Patient meets criteria for HepB screening? No. Ordered? no Patient meets criteria for HepC screening? No. Ordered? no Recommended condom use with all sex Discussed importance of condom use for STI prevent  Treat positive test results per standing order. Discussed time line for [facility] results and that patient will be called with positive results and encouraged patient to call if he had not heard in 2 weeks Recommended repeat testing in 3 months with positive results. Recommended returning for continued or worsening symptoms.   Return if symptoms worsen or fail to improve.  No future appointments.  Marline Backbone, FNP

## 2022-09-07 NOTE — Progress Notes (Signed)
Pt is here for STD screening.  Gram stain results reviewed.  The patient was dispensed Doxcycline 100 mg #14 today. I provided counseling today regarding the medication. We discussed the medication, the side effects and when to call clinic. Patient given the opportunity to ask questions. Questions answered.  Condoms declined.  Windle Guard, RN

## 2022-09-09 LAB — CHLAMYDIA/GC NAA, CONFIRMATION
Chlamydia trachomatis, NAA: NEGATIVE
Neisseria gonorrhoeae, NAA: NEGATIVE

## 2023-03-08 ENCOUNTER — Ambulatory Visit: Payer: BC Managed Care – PPO

## 2023-03-23 ENCOUNTER — Other Ambulatory Visit: Payer: Self-pay

## 2023-03-23 ENCOUNTER — Ambulatory Visit: Payer: BC Managed Care – PPO | Admitting: Family Medicine

## 2023-03-23 ENCOUNTER — Encounter: Payer: Self-pay | Admitting: Family Medicine

## 2023-03-23 DIAGNOSIS — N341 Nonspecific urethritis: Secondary | ICD-10-CM

## 2023-03-23 DIAGNOSIS — F17210 Nicotine dependence, cigarettes, uncomplicated: Secondary | ICD-10-CM

## 2023-03-23 DIAGNOSIS — Z113 Encounter for screening for infections with a predominantly sexual mode of transmission: Secondary | ICD-10-CM

## 2023-03-23 HISTORY — DX: Nicotine dependence, cigarettes, uncomplicated: F17.210

## 2023-03-23 HISTORY — DX: Nonspecific urethritis: N34.1

## 2023-03-23 LAB — GRAM STAIN

## 2023-03-23 MED ORDER — DOXYCYCLINE HYCLATE 100 MG PO TABS: 100.0000 mg | ORAL_TABLET | Freq: Two times a day (BID) | ORAL | Status: AC

## 2023-03-23 NOTE — Progress Notes (Signed)
Pt is here for STD screening.  Gram stain results reviewed.  The patient was dispensed doxycycline today. I provided counseling today regarding the medication. We discussed the medication, the side effects and when to call clinic. Patient given the opportunity to ask questions. Questions answered.  Condoms and contact card given. Gaspar Garbe, RN

## 2023-03-23 NOTE — Progress Notes (Signed)

## 2023-03-23 NOTE — Progress Notes (Signed)
[facility] Department STI clinic/screening visit  Subjective:  Wayne Bryant is a 52 y.o. male being seen today for an STI screening visit. The patient reports they do have symptoms.    Patient has the following medical conditions:   Patient Active Problem List   Diagnosis Date Noted   NGU (nongonococcal urethritis) 03/23/2023   Cigarette smoker 03/23/2023    Chief Complaint  Patient presents with   SEXUALLY TRANSMITTED DISEASE    Patient is a pleasant 52 year old male who presents today with concern of itching to the tip of his penis that began in March (approximately 7 months ago). He states at that time he had a new partner and was seen in this clinic and positive for NGU. He was given oral antibiotic treatment, but did not finish the medication, taking only one pill. He states he now has a new partner and the symptoms have never resolved and wants to get tested and treated again if necessary. He states the new partner also has symptoms and is currently being treated. The patient reports last sexual intercourse was 02/12/23 with a male, never had male partners, without a condom (reports never using condoms). Patient denies oral or rectal sex.  Patient denies all other symptoms consistent with STI.    Last HIV test per patient/review of record was  Lab Results  Component Value Date   HMHIVSCREEN Negative - Validated 09/07/2022   No results found for: "HIV"  Last HEPC test per patient/review of record was not performed per record.  Last HEPB test per patient/review of record was not performed per record.   Does the patient or their partner desires a pregnancy in the next year? No  Screening for MPX risk: Does the patient have an unexplained rash? No Is the patient MSM? No Does the patient endorse multiple sex partners or anonymous sex partners? No Did the patient have close or sexual contact with a person diagnosed with MPX? No Has the patient traveled outside  the [place] where MPX is endemic? No Is there a high clinical suspicion for MPX-- evidenced by one of the following No  -Unlikely to be chickenpox  -Lymphadenopathy  -Rash that present in same phase of evolution on any given body part   See flowsheet for further details and programmatic requirements.   Immunization History  Administered Date(s) Administered   Tdap 04/28/2018     The following portions of the patient's history were reviewed and updated as appropriate: allergies, current medications, past medical history, past social history, past surgical history and problem list.  Objective:  There were no vitals filed for this visit.  Physical Exam Nursing note reviewed. Exam conducted with a chaperone present Cameron Proud, RN present as chaperone in room during PE.).  Constitutional:      Appearance: Normal appearance.  HENT:     Head: Normocephalic and atraumatic.     Salivary Glands: Right salivary gland is not diffusely enlarged or tender. Left salivary gland is not diffusely enlarged or tender.     Mouth/Throat:     Lips: Pink. No lesions.     Mouth: Mucous membranes are moist. No oral lesions.     Tongue: No lesions.     Pharynx: Oropharynx is clear. Uvula midline. No oropharyngeal exudate or posterior oropharyngeal erythema.     Tonsils: No tonsillar exudate.  Eyes:     General:        Right eye: No discharge.  Left eye: No discharge.     Conjunctiva/sclera:     Right eye: Right conjunctiva is not injected. No exudate.    Left eye: Left conjunctiva is not injected. No exudate. Pulmonary:     Effort: Pulmonary effort is normal.  Genitourinary:    Pubic Area: No rash or pubic lice (no nits).      Penis: Normal. No tenderness, discharge, swelling or lesions.      Testes: Normal.     Epididymis:     Right: Normal. No mass or tenderness.     Left: Normal. No mass or tenderness.     Tanner stage (genital): 5.     Rectum: Normal. No tenderness (no lesions or  discharge).     Comments: Penile Discharge Amount: scant Color:  whitish Lymphadenopathy:     Head:     Right side of head: No submental, submandibular, tonsillar, preauricular or posterior auricular adenopathy.     Left side of head: No submental, submandibular, tonsillar, preauricular or posterior auricular adenopathy.     Cervical: No cervical adenopathy.     Right cervical: No superficial or posterior cervical adenopathy.    Left cervical: No superficial or posterior cervical adenopathy.     Upper Body:     Right upper body: No supraclavicular or axillary adenopathy.     Left upper body: No supraclavicular or axillary adenopathy.     Lower Body: No right inguinal adenopathy. No left inguinal adenopathy.  Skin:    General: Skin is warm and dry.     Findings: No lesion or rash.  Neurological:     Mental Status: He is alert and oriented to person, place, and time.  Psychiatric:        Attention and Perception: Attention normal.        Mood and Affect: Mood normal.        Speech: Speech normal.        Behavior: Behavior normal. Behavior is cooperative.        Thought Content: Thought content normal.     Assessment and Plan:  Wayne Bryant is a 52 y.o. male presenting to the [facility] Department for STI screening  1. Screening for venereal disease Retested for NGU due to persistent symptoms, new penile discharge, and incomplete treatment 7 months ago with NGU diagnosis. Also patient has a partner with symptoms being treated.   - Gram stain - Chlamydia/GC NAA, Confirmation  2. NGU (nongonococcal urethritis)  - doxycycline (VIBRA-TABS) 100 MG tablet; Take 1 tablet (100 mg total) by mouth 2 (two) times daily for 7 days.  Patient does have STI symptoms Patient accepted all screenings including urine GC/Chlamydia. Patient meets criteria for HepB screening? No. Ordered? not applicable Patient meets criteria for HepC screening? No. Ordered? not  applicable Recommended condom use with all sex Discussed importance of condom use for STI prevent  Treat positive test results per standing order.  Recommended returning for continued or worsening symptoms.   Return if symptoms worsen or fail to improve.  No future appointments.  Total time with patient 30 minutes.   Edmonia James, NP

## 2023-03-27 LAB — CHLAMYDIA/GC NAA, CONFIRMATION
Chlamydia trachomatis, NAA: NEGATIVE
Neisseria gonorrhoeae, NAA: NEGATIVE
